# Patient Record
Sex: Male | Born: 2000 | Hispanic: Yes | Marital: Single | Smoking: Never smoker
Health system: Southern US, Community
[De-identification: ages and names within clinical notes are randomized; demographics above are authoritative.]

---
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
## 2021-09-23 ENCOUNTER — Encounter (HOSPITAL_COMMUNITY): Payer: Self-pay

## 2021-09-23 ENCOUNTER — Other Ambulatory Visit: Payer: Self-pay

## 2021-09-23 ENCOUNTER — Ambulatory Visit (HOSPITAL_COMMUNITY)
Admission: EM | Admit: 2021-09-23 | Discharge: 2021-09-23 | Disposition: A | Discharge status: Home / Self Care | Payer: Medicaid Other | Attending: Student | Admitting: Student

## 2021-09-23 ENCOUNTER — Ambulatory Visit (INDEPENDENT_AMBULATORY_CARE_PROVIDER_SITE_OTHER): Payer: Medicaid Other

## 2021-09-23 DIAGNOSIS — S67193A Crushing injury of left middle finger, initial encounter: Secondary | ICD-10-CM | POA: Diagnosis not present

## 2021-09-23 DIAGNOSIS — Z789 Other specified health status: Secondary | ICD-10-CM

## 2021-09-23 DIAGNOSIS — M79642 Pain in left hand: Secondary | ICD-10-CM

## 2021-09-23 DIAGNOSIS — Z23 Encounter for immunization: Secondary | ICD-10-CM

## 2021-09-23 MED ORDER — CEPHALEXIN 500 MG PO CAPS: 500.0000 mg | ORAL_CAPSULE | Freq: Four times a day (QID) | ORAL | 0 refills | Status: DC

## 2021-09-23 MED ORDER — TETANUS-DIPHTH-ACELL PERTUSSIS 5-2.5-18.5 LF-MCG/0.5 IM SUSY
0.5000 mL | PREFILLED_SYRINGE | Freq: Once | INTRAMUSCULAR | Status: AC
  Administered 2021-09-23: 0.5 mL via INTRAMUSCULAR

## 2021-09-23 MED ORDER — TETANUS-DIPHTH-ACELL PERTUSSIS 5-2.5-18.5 LF-MCG/0.5 IM SUSY
PREFILLED_SYRINGE | INTRAMUSCULAR | Status: AC
  Filled 2021-09-23: qty 0.5

## 2021-09-23 NOTE — ED Triage Notes (Signed)
Today, while at work Pt smashed his left 3rd digit on a lift gate causing a laceration to the tip of the finger. Pt poured water on the cut and applied a band aid.  ? ?

## 2021-09-23 NOTE — Discharge Instructions (Addendum)
-  Start the antibiotic: Keflex, 4x daily x5 days. You can take this with food if you have a sensitive stomach. ?-Wash your wound with gentle soap and water 1-2 times daily.  Let air dry or gently pat. You can follow with over-the-counter neosporin ointment (or similar). Keep wrapped during the day or when you're doing something that could get it dirty (working, [employer], cooking, Catering manager). Avoid cleansing with hydrogen peroxide or alcohol!! ?-Seek additional medical attention if the wound is getting worse instead of better- redness increasing in size, pain getting worse, new/worsening discharge, new fevers/chills, etc. ?-Use the finger splint while pain persists  ?-If symptoms persist in 4-5 days, or new symptoms like finger swelling, pus, sensation changes, fevers - seek additional immediate medical attention. Ideally, follow-up with the hand specialist (information below). Your PCP can help set this up; their name is on your medicaid card.  ?

## 2021-09-23 NOTE — ED Provider Notes (Signed)
?[facility] ? ? ? ?CSN: [id number] ?Arrival date & time: 09/23/21  1456 ? ? ?  ? ?History   ?Chief Complaint ?Chief Complaint  ?Patient presents with  ? Finger Injury  ?  Left 3rd digit  ? ? ?HPI ?Rick Villegas is a 21 y.o. male presenting with L middle finger injury. History noncontributory, he is right handed. Spanish speaking only. States he smashed the L middle finger on a lift gate, causing a small laceration and significant pain. Poured water on the cut and applied a bandaid and came straight here. Unsure of tdap status. No abx allergies. Not immunocompromised.  ? ?HPI ? ?History reviewed. No pertinent past medical history. ? ?There are no problems to display for this patient. ? ? ?History reviewed. No pertinent surgical history. ? ? ? ? ?Home Medications   ? ?Prior to Admission medications   ?Medication Sig Start Date End Date Taking? Authorizing Provider  ?cephALEXin (KEFLEX) 500 MG capsule Take 1 capsule (500 mg total) by mouth 4 (four) times daily. 09/23/21  Yes Hazel Sams, PA-C  ?naproxen (NAPROSYN) 500 MG tablet Take 1 tablet (500 mg total) by mouth 2 (two) times daily. 02/08/19   Jacqlyn Larsen, PA-C  ? ? ?Family History ?Family History  ?Problem Relation Age of Onset  ? Healthy Mother   ? Healthy Father   ? ? ?Social History ?Social History  ? ?Tobacco Use  ? Smoking status: Never  ? Smokeless tobacco: Never  ?Vaping Use  ? Vaping Use: Some days  ? ? ? ?Allergies   ?Patient has no known allergies. ? ? ?Review of Systems ?Review of Systems  ?Skin:  Positive for wound.  ?All other systems reviewed and are negative. ? ? ?Physical Exam ?Triage Vital Signs ?ED Triage Vitals  ?Enc Vitals Group  ?   BP 09/23/21 1519 124/72  ?   Pulse Rate 09/23/21 1519 73  ?   Resp 09/23/21 1519 16  ?   Temp 09/23/21 1519 98.6 ?F (37 ?C)  ?   Temp Source 09/23/21 1519 Oral  ?   SpO2 09/23/21 1519 100 %  ?   Weight --   ?   Height --   ?   Head Circumference --   ?   Peak Flow --   ?   Pain Score  09/23/21 1525 5  ?   Pain Loc --   ?   Pain Edu? --   ?   Excl. in [place]? --   ? ?No data found. ? ?Updated Vital Signs ?BP 124/72 (BP Location: Left Arm)   Pulse 73   Temp 98.6 ?F (37 ?C) (Oral)   Resp 16   SpO2 100%  ? ?Visual Acuity ?Right Eye Distance:   ?Left Eye Distance:   ?Bilateral Distance:   ? ?Right Eye Near:   ?Left Eye Near:    ?Bilateral Near:    ? ?Physical Exam ?Vitals reviewed.  ?Constitutional:   ?   General: He is not in acute distress. ?   Appearance: Normal appearance. He is not ill-appearing.  ?HENT:  ?   Head: Normocephalic and atraumatic.  ?Pulmonary:  ?   Effort: Pulmonary effort is normal.  ?Skin: ?   Comments: L middle finger - distal phalanx is mildly swollen and exquisitely tender to palpation. Small 2cm laceration to the distal phalanx. No nail damage. Minimal pain to palpation of the DIP and PIP joints; ROM limited due to pain. Cap refill <2 seconds,  sensation intact.   ?Neurological:  ?   General: No focal deficit present.  ?   Mental Status: He is alert and oriented to person, place, and time.  ?Psychiatric:     ?   Mood and Affect: Mood normal.     ?   Behavior: Behavior normal.     ?   Thought Content: Thought content normal.     ?   Judgment: Judgment normal.  ? ? ? ?UC Treatments / Results  ?Labs ?(all labs ordered are listed, but only abnormal results are displayed) ?Labs Reviewed - No data to display ? ?EKG ? ? ?Radiology ?DG Hand Complete Left ? ?Result Date: 09/23/2021 ?CLINICAL DATA:  Trauma EXAM: LEFT HAND - COMPLETE 3 VIEW COMPARISON:  None. FINDINGS: No recent fracture or dislocation is seen. There are no radiopaque foreign bodies. 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 IMPRESSION: No fracture or dislocation is seen in the left hand. Electronically Signed   By: Elmer Picker M.D.   On: 09/23/2021 15:59   ? ?Procedures ?Procedures (including critical care time) ? ?Medications Ordered in UC ?Medications  ?Tdap (BOOSTRIX) injection 0.5 mL (has no  administration in time range)  ? ? ?Initial Impression / Assessment and Plan / UC Course  ?I have reviewed the triage vital signs and the nursing notes. ? ?Pertinent labs & imaging results that were available during my care of the patient were reviewed by me and considered in my medical decision making (see chart for details). ? ?  ? ?This patient is a very pleasant 21 y.o. year old male presenting with L middle finger contusion. Neurovascularly intact. Tdap administered today.  ? ?Xray L hand - negative. ? ?Given crush injury with laceration, will cover with Keflex. Wound care provided and discussed.  ? ?Dressing applied, finger splint applied, f/u with hand specialist. As he has [employer] he will require referral from PCP.  ? ?ED return precautions discussed. Patient verbalizes understanding and agreement.  ? ?Spoke with this patient using language line - interpreter [phone] ? ?Final Clinical Impressions(s) / UC Diagnoses  ? ?Final diagnoses:  ?Crushing injury of left middle finger, initial encounter  ?Language barrier  ? ? ? ?Discharge Instructions   ? ?  ?-Start the antibiotic: Keflex, 4x daily x5 days. You can take this with food if you have a sensitive stomach. ?-Wash your wound with gentle soap and water 1-2 times daily.  Let air dry or gently pat. You can follow with over-the-counter neosporin ointment (or similar). Keep wrapped during the day or when you're doing something that could get it dirty (working, [employer], cooking, Social research officer, government). Avoid cleansing with hydrogen peroxide or alcohol!! ?-Seek additional medical attention if the wound is getting worse instead of better- redness increasing in size, pain getting worse, new/worsening discharge, new fevers/chills, etc. ?-Use the finger splint while pain persists  ?-If symptoms persist in 4-5 days, or new symptoms like finger swelling, pus, sensation changes, fevers - seek additional immediate medical attention. Ideally, follow-up with the hand specialist  (information below). Your PCP can help set this up; their name is on your medicaid card.  ? ? ? ? ?ED Prescriptions   ? ? Medication Sig Dispense Auth. Provider  ? cephALEXin (KEFLEX) 500 MG capsule Take 1 capsule (500 mg total) by mouth 4 (four) times daily. 20 capsule Hazel Sams, PA-C  ? ?  ? ?PDMP not reviewed this encounter. ?  ?Hazel Sams, PA-C ?09/23/21 1624 ? ?

## 2022-07-18 ENCOUNTER — Emergency Department (HOSPITAL_COMMUNITY)
Admission: EM | Admit: 2022-07-18 | Discharge: 2022-07-18 | Discharge status: Against Medical Advice | Payer: Medicaid Other | Attending: Emergency Medicine | Admitting: Emergency Medicine

## 2022-07-18 DIAGNOSIS — R002 Palpitations: Secondary | ICD-10-CM | POA: Diagnosis present

## 2022-07-18 DIAGNOSIS — R051 Acute cough: Secondary | ICD-10-CM | POA: Diagnosis not present

## 2022-07-18 DIAGNOSIS — Z1152 Encounter for screening for COVID-19: Secondary | ICD-10-CM | POA: Diagnosis not present

## 2022-07-18 LAB — RESP PANEL BY RT-PCR (RSV, FLU A&B, COVID)  RVPGX2
Influenza A by PCR: NEGATIVE
Influenza B by PCR: NEGATIVE
Resp Syncytial Virus by PCR: NEGATIVE
SARS Coronavirus 2 by RT PCR: NEGATIVE

## 2022-07-18 NOTE — ED Provider Notes (Signed)
[facility] DEPT Provider Note   CSN: [id number] Arrival date & time: 07/18/22  0144     History  Chief Complaint  Patient presents with   Palpitations   Cough    Rick Villegas is a 22 y.o. male.  HPI     History obtained via Spanish interpreter.  This is a 22 year old male who presents with palpitations and chest pain.  Patient reports that he had palpitations and pain in his chest tonight that kept him from falling asleep.  He describes the pain as stabbing.  This is happened before but has self resolved.  He reports chills but no documented fevers.  He has had some cough and upper respiratory symptoms last several days.  Of note, he does report increased alcohol intake last night but states that he does not drink daily.  He drank at least 5 beers.  Denies any drug use.  Denies any changes in medicines.  Denies significant shortness of breath.  Home Medications Prior to Admission medications   Medication Sig Start Date End Date Taking? Authorizing Provider  cephALEXin (KEFLEX) 500 MG capsule Take 1 capsule (500 mg total) by mouth 4 (four) times daily. 09/23/21   Hazel Sams, PA-C  naproxen (NAPROSYN) 500 MG tablet Take 1 tablet (500 mg total) by mouth 2 (two) times daily. 02/08/19   Jacqlyn Larsen, PA-C      Allergies    Patient has no known allergies.    Review of Systems   Review of Systems  Constitutional:  Positive for chills.  Respiratory:  Positive for cough.   Cardiovascular:  Positive for chest pain and palpitations.  All other systems reviewed and are negative.   Physical Exam Updated Vital Signs BP 105/67 (BP Location: Right Arm)   Pulse 67   Temp 98.8 F (37.1 C) (Oral)   Resp 18   SpO2 98%  Physical Exam Vitals and nursing note reviewed.  Constitutional:      Appearance: He is well-developed. He is not ill-appearing.  HENT:     Head: Normocephalic and atraumatic.  Eyes:     Pupils: Pupils are equal,  round, and reactive to light.  Cardiovascular:     Rate and Rhythm: Normal rate and regular rhythm.     Heart sounds: Normal heart sounds. No murmur heard. Pulmonary:     Effort: Pulmonary effort is normal. No respiratory distress.     Breath sounds: Normal breath sounds. No wheezing.  Abdominal:     General: Bowel sounds are normal.     Palpations: Abdomen is soft.     Tenderness: There is no abdominal tenderness. There is no rebound.  Musculoskeletal:     Cervical back: Neck supple.  Lymphadenopathy:     Cervical: No cervical adenopathy.  Skin:    General: Skin is warm and dry.  Neurological:     Mental Status: He is alert and oriented to person, place, and time.  Psychiatric:        Mood and Affect: Mood normal.     ED Results / Procedures / Treatments   Labs (all labs ordered are listed, but only abnormal results are displayed) Labs Reviewed  RESP PANEL BY RT-PCR (RSV, FLU A&B, COVID)  [id number]  CBC WITH DIFFERENTIAL/PLATELET  BASIC METABOLIC PANEL  ETHANOL    EKG EKG Interpretation  Date/Time:  Tuesday July 18 2022 01:55:19 EST Ventricular Rate:  74 PR Interval:  142 QRS Duration: 85 QT Interval:  342 QTC Calculation:  380 R Axis:   50 Text Interpretation: Sinus rhythm RSR' in V1 or V2, probably normal variant ST elev, probable normal early repol pattern Confirmed by Thayer Jew [phone]) on 07/18/2022 3:40:43 AM  Radiology No results found.  Procedures Procedures    Medications Ordered in ED Medications - No data to display  ED Course/ Medical Decision Making/ A&P                           Medical Decision Making Amount and/or Complexity of Data Reviewed Labs: ordered.   This patient presents to the ED for concern of palpitations, chest pain, this involves an extensive number of treatment options, and is a complaint that carries with it a high risk of complications and morbidity.  I considered the following differential and admission for this  acute, potentially life threatening condition.  The differential diagnosis includes arrhythmia, acute illness such as COVID or influenza, pneumonia, pneumothorax, atypical ACS, symptoms related to intoxication  MDM:    This is a 22 year old male who presents with palpitations and chest pain.  He is overall nontoxic and vital signs are reassuring.  He is not tachycardic.  EKG shows no signs of arrhythmia.  Viral testing sent from triage reviewed and is negative.  After discussion with the patient, I added additional testing to include basic labs and chest x-ray.  Breath sounds are clear.  After addition of these test, patient states that he cannot stay.  I discussed with him that I thought it was at minimally prudent to get a chest x-ray to ensure no pneumothorax or obvious anatomical abnormality.  Patient declines.  He is asking if he can make an appointment.  I discussed with him that we are in emergency room and do not make appointments.  He understands the risk of leaving including deterioration or worsening of condition.  Patient decided to leave [place].  (Labs, imaging, consults)  Labs: I Ordered, and personally interpreted labs.  The pertinent results include: COVID and influenza testing  Imaging Studies ordered: I ordered imaging studies including none I independently visualized and interpreted imaging. I agree with the radiologist interpretation  Additional history obtained from girlfriend at bedside.  External records from outside source obtained and reviewed including prior evaluations  Cardiac Monitoring: The patient was maintained on a cardiac monitor.  I personally viewed and interpreted the cardiac monitored which showed an underlying rhythm of: Sinus rhythm  Reevaluation: After the interventions noted above, I reevaluated the patient and found that they have :stayed the same  Social Determinants of Health:  lives independently  Disposition: AMA  Co  morbidities that complicate the patient evaluation No past medical history on file.   Medicines No orders of the defined types were placed in this encounter.   I have reviewed the patients home medicines and have made adjustments as needed  Problem List / ED Course: Problem List Items Addressed This Visit   None Visit Diagnoses     Palpitations    -  Primary   Acute cough                       Final Clinical Impression(s) / ED Diagnoses Final diagnoses:  Palpitations  Acute cough    Rx / DC Orders ED Discharge Orders     None         Merryl Hacker, MD 07/18/22 [phone]

## 2022-07-18 NOTE — ED Triage Notes (Addendum)
Pt states that he can't sleep and feels like his heart is beating "hard." Took 2 81mg  aspirin PTA. This episode started about 2 hours ago, but this has happened before. Also states that he has been feeling sick the last few days. Endorses chills, cough and congestion. No known sick contacts. Spanish interpreter used.

## 2022-12-11 IMAGING — DX DG HAND COMPLETE 3+V*L*
3 series · 3 of 3 positions shown · non-contrast
Comparison: None.

CLINICAL DATA: Trauma

EXAM:
LEFT HAND - COMPLETE 3 VIEW

[hand pa]
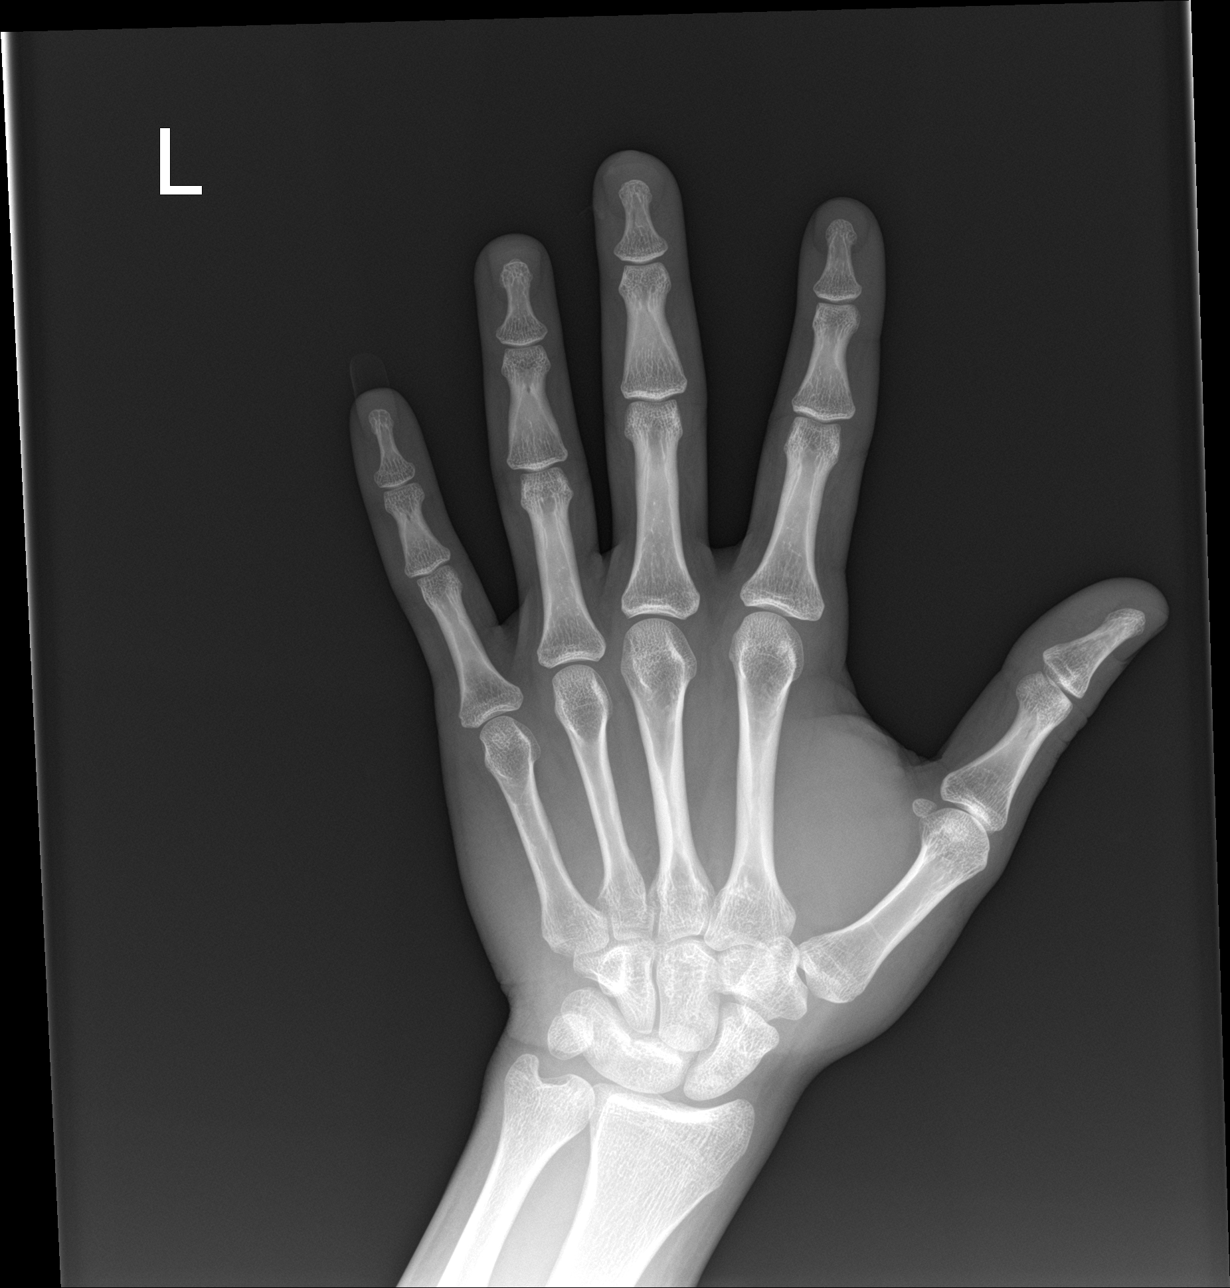

[hand obl]
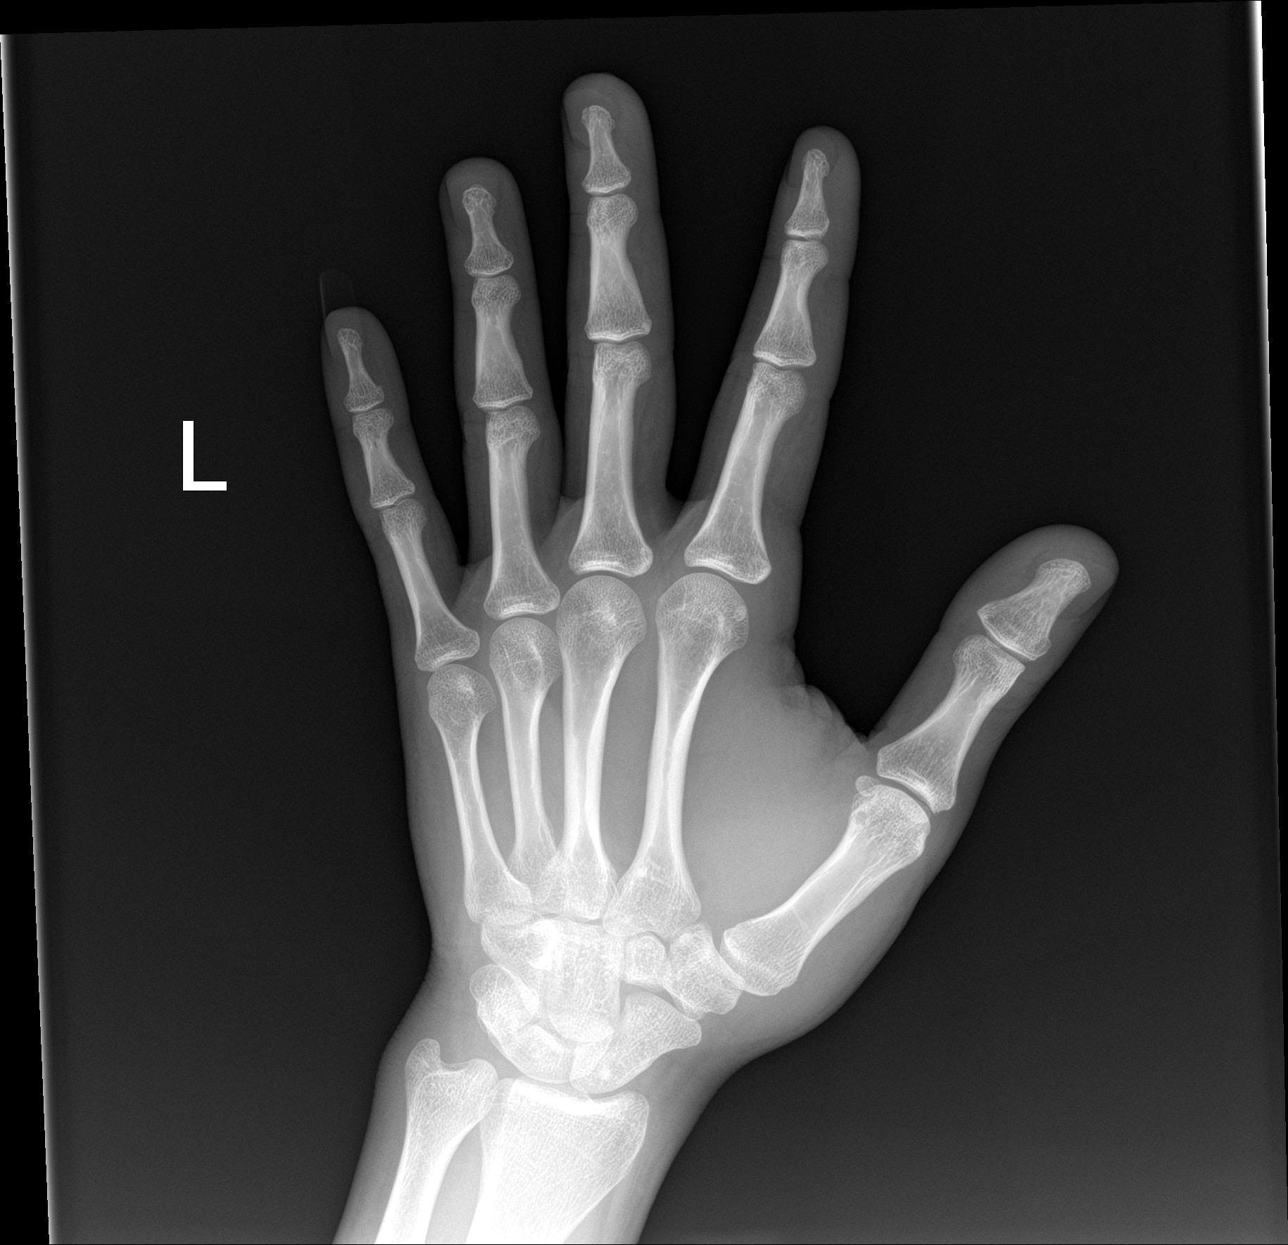

[hand lat]
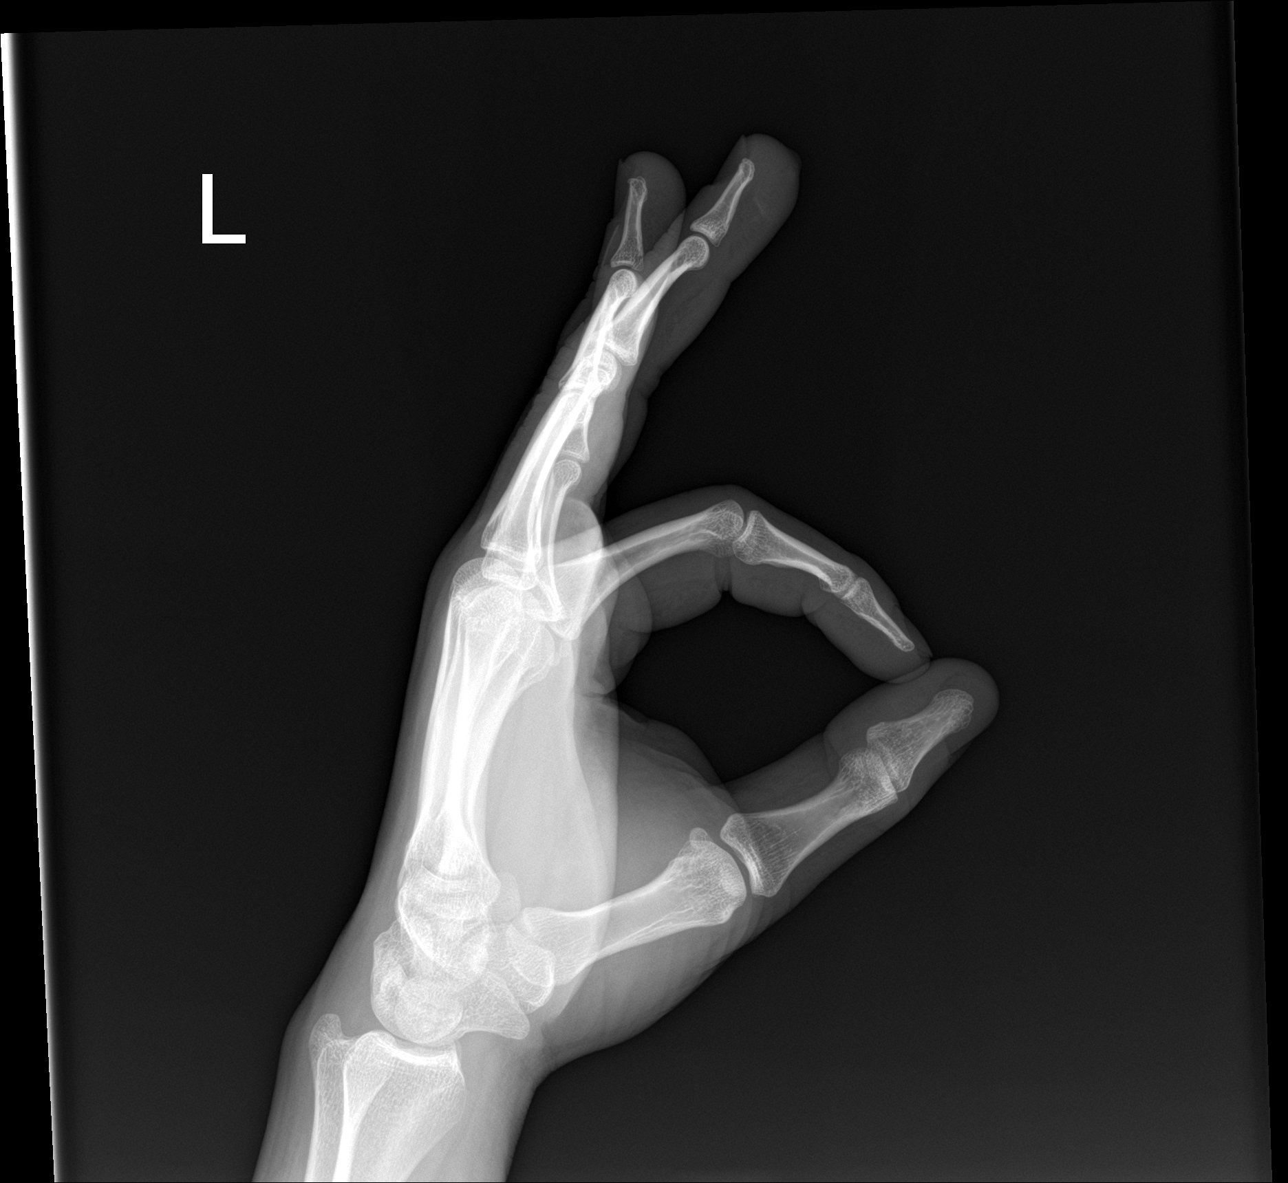

[3 of 3 positions shown; findings below may reference images not displayed]

FINDINGS: No recent fracture or dislocation is seen. There are no radiopaque
foreign bodies. There is congenital fusion of triquetrum and lunate.
IMPRESSION: No fracture or dislocation is seen in the left hand.

## 2023-04-05 ENCOUNTER — Emergency Department (HOSPITAL_COMMUNITY): Payer: Medicaid Other

## 2023-04-05 ENCOUNTER — Other Ambulatory Visit: Payer: Self-pay

## 2023-04-05 ENCOUNTER — Emergency Department (HOSPITAL_COMMUNITY)
Admission: EM | Admit: 2023-04-05 | Discharge: 2023-04-05 | Discharge status: Against Medical Advice | Payer: Medicaid Other | Attending: Emergency Medicine | Admitting: Emergency Medicine

## 2023-04-05 ENCOUNTER — Encounter (HOSPITAL_COMMUNITY): Payer: Self-pay

## 2023-04-05 DIAGNOSIS — R2241 Localized swelling, mass and lump, right lower limb: Secondary | ICD-10-CM | POA: Insufficient documentation

## 2023-04-05 DIAGNOSIS — Z5321 Procedure and treatment not carried out due to patient leaving prior to being seen by health care provider: Secondary | ICD-10-CM | POA: Insufficient documentation

## 2023-04-05 DIAGNOSIS — M79671 Pain in right foot: Secondary | ICD-10-CM | POA: Insufficient documentation

## 2023-04-05 NOTE — ED Triage Notes (Signed)
Pt c/o right heel painx2d. Pt denies injury. Pt limped to triage room from waiting room. Pt has 1+ swelling to right heel area.

## 2023-04-05 NOTE — ED Notes (Signed)
Pt left AMA

## 2023-04-06 ENCOUNTER — Emergency Department (HOSPITAL_COMMUNITY): Payer: Self-pay

## 2023-04-06 ENCOUNTER — Other Ambulatory Visit: Payer: Self-pay

## 2023-04-06 ENCOUNTER — Emergency Department (HOSPITAL_COMMUNITY)
Admission: EM | Admit: 2023-04-06 | Discharge: 2023-04-06 | Disposition: A | Discharge status: Home / Self Care | Payer: Self-pay | Attending: Emergency Medicine | Admitting: Emergency Medicine

## 2023-04-06 DIAGNOSIS — M79671 Pain in right foot: Secondary | ICD-10-CM | POA: Insufficient documentation

## 2023-04-06 LAB — CBC WITH DIFFERENTIAL/PLATELET
Abs Immature Granulocytes: 0.02 10*3/uL (ref 0.00–0.07)
Basophils Absolute: 0 10*3/uL (ref 0.0–0.1)
Basophils Relative: 1 %
Eosinophils Absolute: 0.1 10*3/uL (ref 0.0–0.5)
Eosinophils Relative: 2 %
HCT: 45.5 % (ref 39.0–52.0)
Hemoglobin: 15 g/dL (ref 13.0–17.0)
Immature Granulocytes: 0 %
Lymphocytes Relative: 27 %
Lymphs Abs: 2.1 10*3/uL (ref 0.7–4.0)
MCH: 26.9 pg (ref 26.0–34.0)
MCHC: 33 g/dL (ref 30.0–36.0)
MCV: 81.7 fL (ref 80.0–100.0)
Monocytes Absolute: 1 10*3/uL (ref 0.1–1.0)
Monocytes Relative: 13 %
Neutro Abs: 4.5 10*3/uL (ref 1.7–7.7)
Neutrophils Relative %: 57 %
Platelets: 219 10*3/uL (ref 150–400)
RBC: 5.57 MIL/uL (ref 4.22–5.81)
RDW: 13.2 % (ref 11.5–15.5)
WBC: 7.7 10*3/uL (ref 4.0–10.5)
nRBC: 0 % (ref 0.0–0.2)

## 2023-04-06 LAB — BASIC METABOLIC PANEL
Anion gap: 10 (ref 5–15)
BUN: 14 mg/dL (ref 6–20)
CO2: 25 mmol/L (ref 22–32)
Calcium: 9.2 mg/dL (ref 8.9–10.3)
Chloride: 99 mmol/L (ref 98–111)
Creatinine, Ser: 1.09 mg/dL (ref 0.61–1.24)
GFR, Estimated: >60 mL/min (ref >60)
Glucose, Bld: 93 mg/dL (ref 70–99)
Potassium: 3.6 mmol/L (ref 3.5–5.1)
Sodium: 134 mmol/L — ABNORMAL LOW (ref 135–145)

## 2023-04-06 LAB — I-STAT CG4 LACTIC ACID, ED: Lactic Acid, Venous: 0.6 mmol/L (ref 0.5–1.9)

## 2023-04-06 LAB — D-DIMER, QUANTITATIVE: D-Dimer, Quant: <0.27 ug/mL-FEU (ref 0.00–0.50)

## 2023-04-06 MED ORDER — KETOROLAC TROMETHAMINE 30 MG/ML IJ SOLN
30.0000 mg | Freq: Once | INTRAMUSCULAR | Status: AC
  Administered 2023-04-06: 30 mg via INTRAVENOUS
  Filled 2023-04-06: qty 1

## 2023-04-06 MED ORDER — CEPHALEXIN 500 MG PO CAPS: 500.0000 mg | ORAL_CAPSULE | Freq: Four times a day (QID) | ORAL | 0 refills | Status: AC

## 2023-04-06 MED ORDER — SODIUM CHLORIDE 0.9 % IV SOLN
1.0000 g | Freq: Once | INTRAVENOUS | Status: AC
  Administered 2023-04-06: 1 g via INTRAVENOUS
  Filled 2023-04-06: qty 10

## 2023-04-06 MED ORDER — OXYCODONE-ACETAMINOPHEN 5-325 MG PO TABS
2.0000 | ORAL_TABLET | Freq: Once | ORAL | Status: AC
  Administered 2023-04-06: 2 via ORAL
  Filled 2023-04-06: qty 2

## 2023-04-06 NOTE — ED Notes (Signed)
Pt returned from CT °

## 2023-04-06 NOTE — ED Notes (Signed)
Patient transported to CT 

## 2023-04-06 NOTE — ED Provider Notes (Signed)
[place] EMERGENCY DEPARTMENT AT [facility] Provider Note   CSN: [id number] Arrival date & time: 04/06/23  0730     History  Chief Complaint  Patient presents with   Foot Pain    Rick Villegas is a 22 y.o. male.  HPI History obtained through interpreter 22 year old male presents today complaining of right heel pain that began 2 to 3 days ago.  He did not have any known injury.  He states he just began having pain in the right heel.  Has been very tender and has had difficulty putting any weight on it.  The pain has worsened over the past several days.  He has some mild swelling and redness.  He feels that he has had fever at night although he has not taken his temperature.  He states that he wakes up when sweats and his body has been very warm.  He has been taking diclofenac for pain.  He denies any recent history of trauma, bite, sting.  Began while he was sleeping.  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  He denies any significant past medical history including diabetes, envenomation, or recent trauma    Home Medications Prior to Admission medications   Medication Sig Start Date End Date Taking? Authorizing Provider  cephALEXin (KEFLEX) 500 MG capsule Take 1 capsule (500 mg total) by mouth 4 (four) times daily. 04/06/23  Yes Margarita Grizzle, MD  naproxen (NAPROSYN) 500 MG tablet Take 1 tablet (500 mg total) by mouth 2 (two) times daily. 02/08/19   Dartha Lodge, PA-C      Allergies    Patient has no known allergies.    Review of Systems   Review of Systems  Physical Exam Updated Vital Signs BP 129/82   Pulse 82   Temp 98.3 F (36.8 C)   Resp 17   SpO2 99%  Physical Exam Vitals reviewed.  Constitutional:      Appearance: Normal appearance.  HENT:     Head: Normocephalic.     Right Ear: External ear normal.     Left Ear: External ear normal.     Nose: Nose normal.      Mouth/Throat:     Pharynx: Oropharynx is clear.  Eyes:     Extraocular Movements: Extraocular movements intact.     Pupils: Pupils are equal, round, and reactive to light.  Cardiovascular:     Rate and Rhythm: Normal rate and regular rhythm.     Pulses: Normal pulses.  Pulmonary:     Effort: Pulmonary effort is normal.  Abdominal:     General: Abdomen is flat.     Palpations: Abdomen is soft.  Musculoskeletal:        General: Swelling and tenderness present. No deformity.     Cervical back: Normal range of motion.     Right lower leg: No edema.     Comments: Right heel is visually and expected and appears to be somewhat enlarged from left heel with mild erythema.  No obvious trauma or envenomation site. He has good pulses.  He is intact neurologically.  He has good dorsi and plantarflexion without increased pain from this.  There is some mild swelling around the ankle and some tenderness of the medial aspect of the right lower leg.  He has no popliteal tenderness, erythema, or medial thigh redness or swelling. Full active range of the right lower extremity.  No delineated erythematous area  Skin:    Capillary Refill: Capillary refill takes less than 2 seconds.  Neurological:     General: No focal deficit present.     Mental Status: He is alert.  Psychiatric:        Mood and Affect: Mood normal.     ED Results / Procedures / Treatments   Labs (all labs ordered are listed, but only abnormal results are displayed) Labs Reviewed  BASIC METABOLIC PANEL - Abnormal; Notable for the following components:      Result Value   Sodium 134 (*)    All other components within normal limits  CULTURE, BLOOD (ROUTINE X 2)  CULTURE, BLOOD (ROUTINE X 2)  CBC WITH DIFFERENTIAL/PLATELET  D-DIMER, QUANTITATIVE  I-STAT CG4 LACTIC ACID, ED  I-STAT CG4 LACTIC ACID, ED    EKG None  Radiology CT Foot Right Wo Contrast  Result Date: 04/06/2023 CLINICAL DATA:  Right heel pain and swelling for  the past 2 days. EXAM: CT OF THE RIGHT FOOT WITHOUT CONTRAST TECHNIQUE: Multidetector CT imaging of the right foot was performed according to the standard protocol. Multiplanar CT image reconstructions were also generated. RADIATION DOSE REDUCTION: This exam was performed according to the departmental dose-optimization program which includes automated exposure control, adjustment of the mA and/or kV according to patient size and/or use of iterative reconstruction technique. COMPARISON:  Right foot x-rays from yesterday. FINDINGS: Bones/Joint/Cartilage No acute fracture or dislocation. Well corticated ossific fragment along the anterior aspect of the lateral malleolus, consistent with old avulsion injury. Joint spaces are preserved. No joint effusion. Ligaments Ligaments are suboptimally evaluated by CT. Muscles and Tendons Grossly intact. Soft tissue No fluid collection or hematoma.  No soft tissue mass. IMPRESSION: 1. No acute osseous abnormality. 2. Old lateral malleolar avulsion injury. Electronically Signed   By: Obie Dredge M.D.   On: 04/06/2023 11:43   DG Foot Complete Right  Result Date: 04/05/2023 CLINICAL DATA:  Pain EXAM: RIGHT FOOT COMPLETE - 3+ VIEW COMPARISON:  None Available. FINDINGS: No fracture or dislocation is seen. The joint spaces are preserved. The visualized soft tissues are unremarkable. IMPRESSION: Negative. Electronically Signed   By: Charline Bills M.D.   On: 04/05/2023 19:09    Procedures Procedures    Medications Ordered in ED Medications  oxyCODONE-acetaminophen (PERCOCET/ROXICET) 5-325 MG per tablet 2 tablet (has no administration in time range)  cefTRIAXone (ROCEPHIN) 1 g in sodium chloride 0.9 % 100 mL IVPB (0 g Intravenous Stopped 04/06/23 1000)  ketorolac (TORADOL) 30 MG/ML injection 30 mg (30 mg Intravenous Given 04/06/23 0914)    ED Course/ Medical Decision Making/ A&P Clinical Course as of 04/06/23 1229  Fri Apr 06, 2023  1225 CT soft tissue foot shows  no evidence of acute osseous abnormality old lateral malleolar avulsion injury muscles and tendons reported to be grossly intact. [DR]    Clinical Course User Index [DR] Margarita Grizzle, MD                                 Medical Decision Making Amount and/or Complexity of Data Reviewed Labs: ordered. Radiology: ordered.  Risk Prescription drug management.   22 year old male with right ankle pain, subjective fevers, redness. Differential diagnosis includes but is not limited to trauma, arthropathies including gout, cellulitis and other infections, envenomations Plan differentiation with CT, labs, and IV antibiotics  CT without any definitive swelling, edema, and no gas noted on  my interpretation or radiologist interpretation White blood cell count is normal # infection less likely D-dimer is normal-DVT unlikely Based on above workup will continue patient on Keflex, with conservative therapies including crutches, elevation, and compression Patient advised regarding need for outpatient follow-up, and return precautions       Final Clinical Impression(s) / ED Diagnoses Final diagnoses:  Pain of right heel    Rx / DC Orders ED Discharge Orders          Ordered    cephALEXin (KEFLEX) 500 MG capsule  4 times daily        04/06/23 1229              Margarita Grizzle, MD 04/06/23 1229

## 2023-04-06 NOTE — Discharge Instructions (Signed)
You were evaluated here in the emergency department for pain in your right heel.  No definite cause of pain was found.  You are being started on antibiotics.  You are being placed on crutches.  Please keep the foot elevated and take the antibiotics.  You can continue over-the-counter acetaminophen and/or ibuprofen as needed for pain. Return if you are having worsening pain, increased swelling, increased redness, or fevers. You should have this rechecked if improving on Monday for evaluation to go back to work.

## 2023-04-06 NOTE — ED Triage Notes (Signed)
Pt. Stated, I hurt my right foot area 2 years ago playing basketball and I was here and the pain is back for 2 days and I can't hardly walk. Xrays shows no fracture yesterday when pt. Here and left without being seen.

## 2023-04-06 NOTE — ED Notes (Addendum)
Patient transported to CT 

## 2023-04-11 LAB — CULTURE, BLOOD (ROUTINE X 2): Culture: NO GROWTH
